# Patient Record
Sex: Male | Born: 1960 | Race: White | Hispanic: No | Marital: Single | Smoking: Never smoker
Health system: Southern US, Community
[De-identification: ages and names within clinical notes are randomized; demographics above are authoritative.]

## PROBLEM LIST (undated history)

## (undated) DIAGNOSIS — I10 Essential (primary) hypertension: Secondary | ICD-10-CM

---

## 2014-12-05 ENCOUNTER — Emergency Department (HOSPITAL_BASED_OUTPATIENT_CLINIC_OR_DEPARTMENT_OTHER): Payer: Managed Care, Other (non HMO)

## 2014-12-05 ENCOUNTER — Encounter (HOSPITAL_BASED_OUTPATIENT_CLINIC_OR_DEPARTMENT_OTHER): Payer: Self-pay | Admitting: *Deleted

## 2014-12-05 ENCOUNTER — Emergency Department (HOSPITAL_BASED_OUTPATIENT_CLINIC_OR_DEPARTMENT_OTHER)
Admission: EM | Admit: 2014-12-05 | Discharge: 2014-12-05 | Disposition: A | Discharge status: Home / Self Care | Payer: Managed Care, Other (non HMO) | Attending: Emergency Medicine | Admitting: Emergency Medicine

## 2014-12-05 DIAGNOSIS — I10 Essential (primary) hypertension: Secondary | ICD-10-CM | POA: Diagnosis not present

## 2014-12-05 DIAGNOSIS — S39012A Strain of muscle, fascia and tendon of lower back, initial encounter: Secondary | ICD-10-CM

## 2014-12-05 DIAGNOSIS — Y92410 Unspecified street and highway as the place of occurrence of the external cause: Secondary | ICD-10-CM | POA: Diagnosis not present

## 2014-12-05 DIAGNOSIS — Y9389 Activity, other specified: Secondary | ICD-10-CM | POA: Insufficient documentation

## 2014-12-05 DIAGNOSIS — S199XXA Unspecified injury of neck, initial encounter: Secondary | ICD-10-CM | POA: Diagnosis present

## 2014-12-05 DIAGNOSIS — S161XXA Strain of muscle, fascia and tendon at neck level, initial encounter: Secondary | ICD-10-CM | POA: Insufficient documentation

## 2014-12-05 DIAGNOSIS — Y998 Other external cause status: Secondary | ICD-10-CM | POA: Insufficient documentation

## 2014-12-05 HISTORY — DX: Essential (primary) hypertension: I10

## 2014-12-05 MED ORDER — CYCLOBENZAPRINE HCL 10 MG PO TABS: 10.0000 mg | ORAL_TABLET | Freq: Three times a day (TID) | ORAL | Status: AC | PRN
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
